# Patient Record
Sex: Female | Born: 1985 | Race: Black or African American | Hispanic: No | Marital: Single | Smoking: Never smoker
Health system: Southern US, Community
[De-identification: ages and names within clinical notes are randomized; demographics above are authoritative.]

## PROBLEM LIST (undated history)

## (undated) DIAGNOSIS — B009 Herpesviral infection, unspecified: Secondary | ICD-10-CM

## (undated) HISTORY — PX: TUBAL LIGATION: SHX77

---
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
## 2006-10-05 ENCOUNTER — Inpatient Hospital Stay (HOSPITAL_COMMUNITY): Admission: AD | Admit: 2006-10-05 | Discharge: 2006-10-05 | Payer: Self-pay | Admitting: Obstetrics and Gynecology

## 2006-10-18 ENCOUNTER — Inpatient Hospital Stay (HOSPITAL_COMMUNITY): Admission: AD | Admit: 2006-10-18 | Discharge: 2006-10-21 | Payer: Self-pay | Admitting: Obstetrics and Gynecology

## 2007-02-22 ENCOUNTER — Emergency Department (HOSPITAL_COMMUNITY): Admission: EM | Admit: 2007-02-22 | Discharge: 2007-02-22 | Payer: Self-pay | Admitting: Emergency Medicine

## 2008-05-11 ENCOUNTER — Emergency Department (HOSPITAL_COMMUNITY): Admission: EM | Admit: 2008-05-11 | Discharge: 2008-05-11 | Payer: Self-pay | Admitting: Family Medicine

## 2008-08-24 ENCOUNTER — Emergency Department (HOSPITAL_COMMUNITY): Admission: EM | Admit: 2008-08-24 | Discharge: 2008-08-24 | Payer: Self-pay | Admitting: Emergency Medicine

## 2008-12-16 ENCOUNTER — Inpatient Hospital Stay (HOSPITAL_COMMUNITY): Admission: AD | Admit: 2008-12-16 | Discharge: 2008-12-16 | Payer: Self-pay | Admitting: Obstetrics and Gynecology

## 2008-12-26 ENCOUNTER — Inpatient Hospital Stay (HOSPITAL_COMMUNITY): Admission: RE | Admit: 2008-12-26 | Discharge: 2008-12-28 | Payer: Self-pay | Admitting: Obstetrics and Gynecology

## 2009-07-05 ENCOUNTER — Ambulatory Visit: Payer: Self-pay | Admitting: Obstetrics & Gynecology

## 2009-07-13 ENCOUNTER — Emergency Department (HOSPITAL_COMMUNITY): Admission: EM | Admit: 2009-07-13 | Discharge: 2009-07-13 | Payer: Self-pay | Admitting: Family Medicine

## 2009-08-06 ENCOUNTER — Ambulatory Visit (HOSPITAL_COMMUNITY): Admission: RE | Admit: 2009-08-06 | Discharge: 2009-08-06 | Payer: Self-pay | Admitting: Obstetrics & Gynecology

## 2009-08-06 ENCOUNTER — Ambulatory Visit: Payer: Self-pay | Admitting: Obstetrics & Gynecology

## 2009-08-14 ENCOUNTER — Ambulatory Visit: Payer: Self-pay | Admitting: Obstetrics and Gynecology

## 2009-08-14 LAB — CONVERTED CEMR LAB
Clue Cells Wet Prep HPF POC: NONE SEEN
Trich, Wet Prep: NONE SEEN

## 2010-04-13 LAB — CBC
Hemoglobin: 13 g/dL (ref 12.0–15.0)
MCH: 32.6 pg (ref 26.0–34.0)
MCHC: 34.8 g/dL (ref 30.0–36.0)
Platelets: 224 10*3/uL (ref 150–400)
RBC: 3.99 MIL/uL (ref 3.87–5.11)
RDW: 12.9 % (ref 11.5–15.5)

## 2010-04-29 LAB — CBC
HCT: 31.6 % — ABNORMAL LOW (ref 36.0–46.0)
HCT: 34.2 % — ABNORMAL LOW (ref 36.0–46.0)
MCHC: 33.8 g/dL (ref 30.0–36.0)
MCV: 91.6 fL (ref 78.0–100.0)
Platelets: 194 10*3/uL (ref 150–400)
RDW: 13.5 % (ref 11.5–15.5)
WBC: 12.6 10*3/uL — ABNORMAL HIGH (ref 4.0–10.5)

## 2010-04-29 LAB — RPR: RPR Ser Ql: NONREACTIVE

## 2010-04-30 LAB — WET PREP, GENITAL: Yeast Wet Prep HPF POC: NONE SEEN

## 2010-05-04 LAB — WET PREP, GENITAL: Trich, Wet Prep: NONE SEEN

## 2010-05-04 LAB — URINALYSIS, ROUTINE W REFLEX MICROSCOPIC
Bilirubin Urine: NEGATIVE
Ketones, ur: NEGATIVE mg/dL
Nitrite: NEGATIVE
Protein, ur: NEGATIVE mg/dL

## 2010-05-04 LAB — URINE MICROSCOPIC-ADD ON

## 2010-05-18 ENCOUNTER — Emergency Department (HOSPITAL_COMMUNITY)
Admission: EM | Admit: 2010-05-18 | Discharge: 2010-05-19 | Disposition: A | Discharge status: Home / Self Care | Payer: Self-pay | Attending: Emergency Medicine | Admitting: Emergency Medicine

## 2010-05-18 DIAGNOSIS — N949 Unspecified condition associated with female genital organs and menstrual cycle: Secondary | ICD-10-CM | POA: Insufficient documentation

## 2010-05-18 DIAGNOSIS — Z760 Encounter for issue of repeat prescription: Secondary | ICD-10-CM | POA: Insufficient documentation

## 2010-05-18 DIAGNOSIS — N938 Other specified abnormal uterine and vaginal bleeding: Secondary | ICD-10-CM | POA: Insufficient documentation

## 2010-05-18 LAB — URINALYSIS, ROUTINE W REFLEX MICROSCOPIC
Bilirubin Urine: NEGATIVE
Ketones, ur: 15 mg/dL — AB
Protein, ur: NEGATIVE mg/dL
Specific Gravity, Urine: 1.03 (ref 1.005–1.030)
Urobilinogen, UA: 0.2 mg/dL (ref 0.0–1.0)
pH: 6 (ref 5.0–8.0)

## 2010-05-18 LAB — URINE MICROSCOPIC-ADD ON

## 2010-05-18 LAB — POCT PREGNANCY, URINE: Preg Test, Ur: NEGATIVE

## 2010-05-19 LAB — WET PREP, GENITAL
Clue Cells Wet Prep HPF POC: NONE SEEN
Trich, Wet Prep: NONE SEEN
Yeast Wet Prep HPF POC: NONE SEEN

## 2010-05-20 LAB — GC/CHLAMYDIA PROBE AMP, GENITAL: GC Probe Amp, Genital: NEGATIVE

## 2010-06-10 NOTE — H&P (Signed)
[name], Beck           ACCOUNT NO.:  [id number]   MEDICAL RECORD NO.:  [id number]          PATIENT TYPE:  INP   LOCATION:  9144                          FACILITY:  [facility]   PHYSICIAN:  Janine Limbo, M.D.DATE OF BIRTH:  11-Dec-1985   DATE OF ADMISSION:  10/18/2006  DATE OF DISCHARGE:                              HISTORY & PHYSICAL   HISTORY OF PRESENT ILLNESS:  The patient is a 25 year old, single, black  female, prima gravida at 39 1/7 weeks who presents with regular  contractions since 6:00 p.m. She denies leaking. She reports position  bright red bleeding. Reports positive fetal movement. Denies HSV  outbreak symptoms. Her pregnancy has been followed by the [facility] MD service and has been remarkable for  1. History of HSV2.  2. History of ASCUS Pap.  3. Group B Strep positive.   Her prenatal labs were collected on 03/31/06, hemoglobin 13.2, hematocrit  39.2, platelets 269,000, blood type 0 positive, antibody negative,  sickle cell trait negative, RPR nonreactive, Rubella immune, hepatitis B  surface antigen negative, HIV nonreactive, cystic fibrosis negative, Pap  with ASCUS at negative high risk HPV, first trimester screen from  04/14/06, within normal limits, 1-hour Glucola is unavailable. Group B  Strep from 10/01/06, was positive, gonorrhea and Chlamydia from that same  day were negative.   HISTORY OF PRESENT PREGNANCY:  The patient presented for care at [facility] on 04/10/06, at 10 3/7 weeks' gestation. The patient has normal  first-trimester screen. Her Pap smear showed ASCUS with negative high-  risk HPV. Plan was made for a repeat Pap in 12 months to include HPV  testing. Anatomy ultrasound at 18 3/7 weeks' gestation shows growth  consistent with previous dating, confirming EDC of 10/24/06. The patient  started Valtrex for HSV suppression at 34.5 weeks' gestation. She had  STD screening at 32 weeks' gestation that was all negative. The  rest of  her prenatal care has been unremarkable. She has remained size equal to  dates, normotensive with negative proteinuria throughout.   OB HISTORY:  She is a prima gravida.   PAST MEDICAL HISTORY:  She has no medication allergies. She experienced  menarche at the age of 52 with irregular cycles lasting 5 to 7 days. She  has used Yaz in the past for contraception. She reports having had the  usual childhood illnesses.   PAST SURGICAL HISTORY:  Surgical history is negative.   FAMILY MEDICAL HISTORY:  Remarkable for a paternal grandfather with  history of heart disease, maternal grandmother and mother with elevated  blood pressure, maternal grandmother with varicosities, maternal  grandmother with diabetes.   GENETIC HISTORY:  Negative.   SOCIAL HISTORY:  The patient is single. Father of the baby's name is  Donna Beck. The patient is a college. Father of the baby is high school  educated and employed as a Copy. They deny any alcohol, tobacco or  illicit drug use with the pregnancy.   OBJECTIVE DATA:  VITAL SIGNS: Stable. She is afebrile.  HEENT: Is grossly within normal limits.  CHEST: Clear to auscultation.  HEART: Regular rate  and rhythm.  ABDOMEN: The abdomen is gravid in contour with fundal height extending  approximately 39 cm above pubic symphysis. Fetal heart rate is reactive  and reassuring. Contractions are every 2 to 3 minutes.  PELVIC: Cervix is 4 cm, 100% effaced, vertex, -2 with intact membranes.  Speculum exam shows small to moderate bright red bleeding, no active  blood flow. No signs and symptoms of HSV outbreak.  EXTREMITIES: Normal.   ASSESSMENT:  1. Intrauterine pregnancy at term.  2. Early active labor.  3. Group B Strep positive.   PLAN:  1. Admit to birthing suites.  2. Routine MD orders.  3. Plan penicillin G for group B Strep prophylaxis.  4. Patient plans epidural for comfort.      Cam Hai, C.N.M.      Janine Limbo,  M.D.  Electronically Signed    KS/MEDQ  D:  10/18/2006  T:  10/19/2006  Job:  [id number]

## 2010-07-10 ENCOUNTER — Encounter: Payer: Self-pay | Admitting: Obstetrics & Gynecology

## 2010-11-06 LAB — CBC
HCT: 36.4
HCT: 38
Hemoglobin: 12.5
Hemoglobin: 13.2
MCHC: 34.3
Platelets: 175
Platelets: 206
RBC: 3.83 — ABNORMAL LOW
RDW: 13.3
WBC: 13.3 — ABNORMAL HIGH
WBC: 9.8

## 2010-11-07 LAB — WET PREP, GENITAL
Clue Cells Wet Prep HPF POC: NONE SEEN
Trich, Wet Prep: NONE SEEN

## 2011-11-28 IMAGING — CR DG CHEST 2V
2 series · 2 of 2 positions shown · non-contrast
Comparison: None.

CLINICAL DATA: Chest pain.

CHEST - 2 VIEW

[view not recorded (1 of 2)]
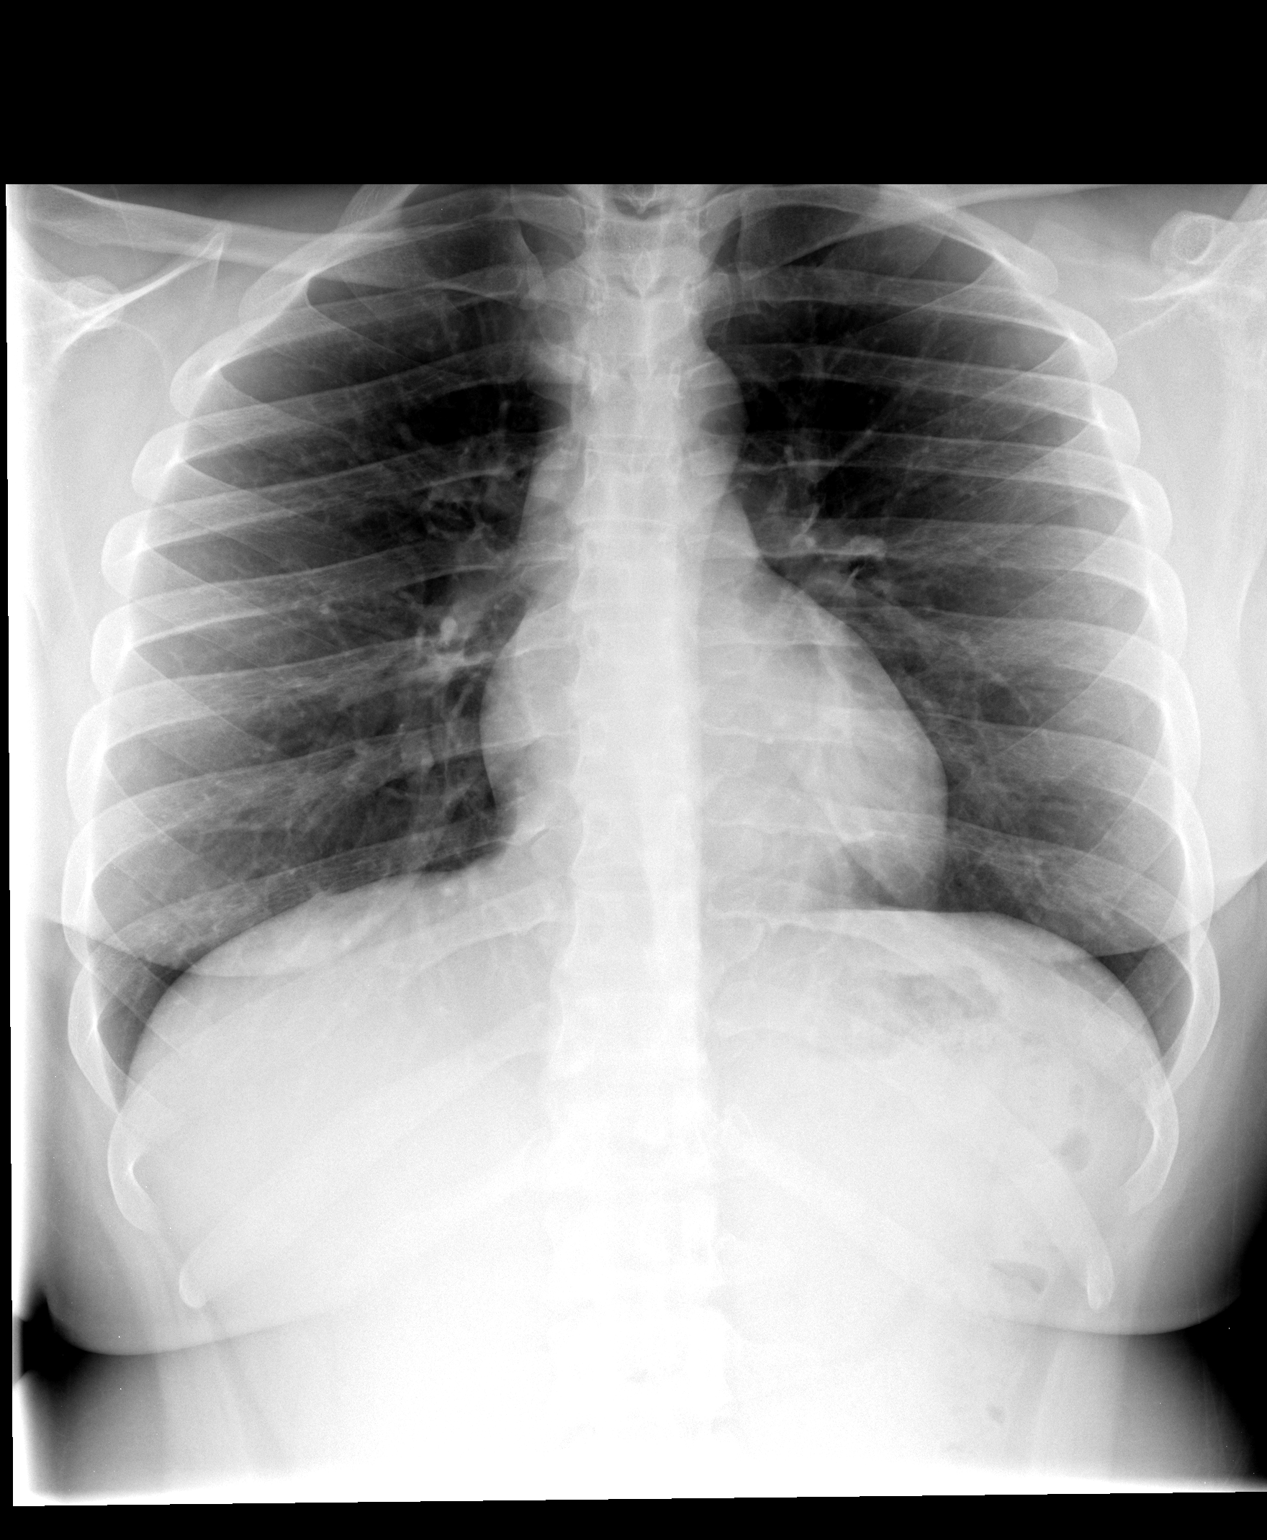

[view not recorded (2 of 2)]
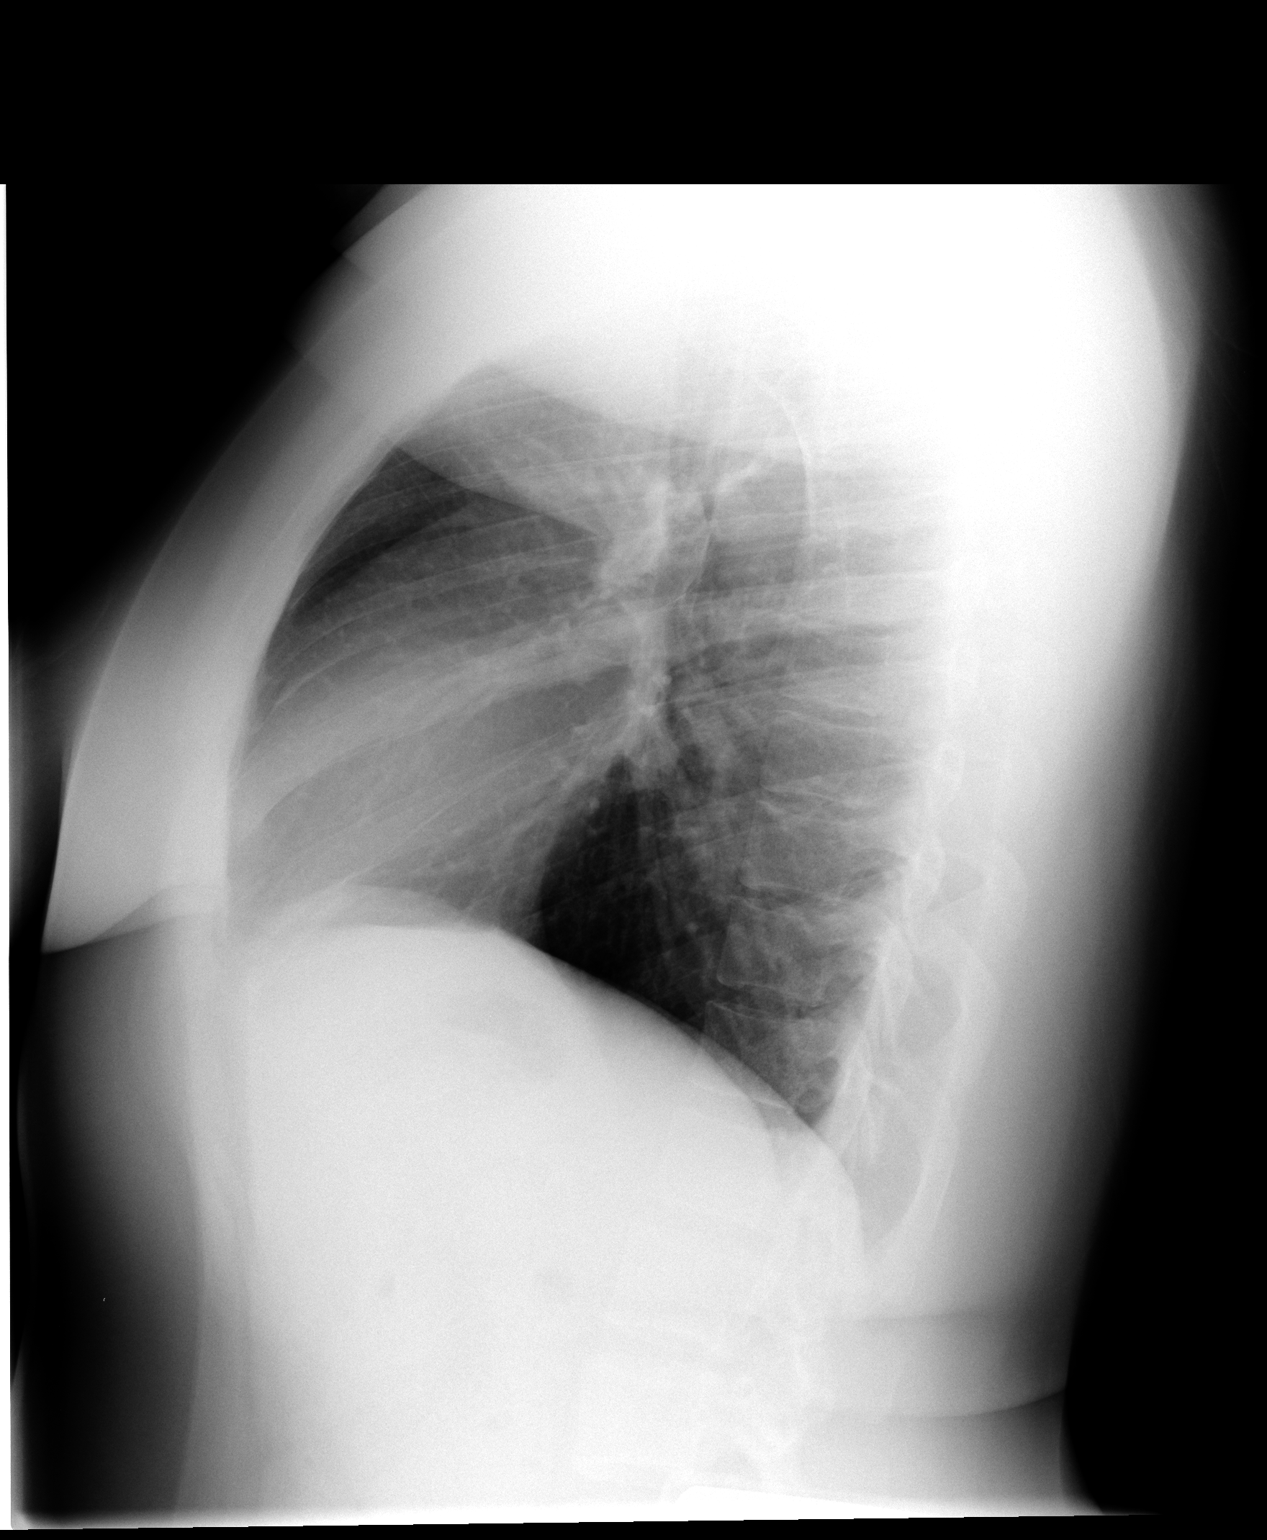

[2 of 2 positions shown; findings below may reference images not displayed]

FINDINGS: The lungs are clear.  Heart size is normal.  No pleural
effusion or focal bony abnormality.
IMPRESSION: No acute disease.

## 2014-03-28 ENCOUNTER — Encounter (HOSPITAL_COMMUNITY): Payer: Self-pay

## 2014-03-28 ENCOUNTER — Inpatient Hospital Stay (HOSPITAL_COMMUNITY)
Admission: AD | Admit: 2014-03-28 | Discharge: 2014-03-28 | Disposition: A | Discharge status: Home / Self Care | Payer: Medicaid Other | Source: Ambulatory Visit | Attending: Obstetrics & Gynecology | Admitting: Obstetrics & Gynecology

## 2014-03-28 DIAGNOSIS — A6000 Herpesviral infection of urogenital system, unspecified: Secondary | ICD-10-CM | POA: Insufficient documentation

## 2014-03-28 DIAGNOSIS — A6009 Herpesviral infection of other urogenital tract: Secondary | ICD-10-CM

## 2014-03-28 DIAGNOSIS — N898 Other specified noninflammatory disorders of vagina: Secondary | ICD-10-CM | POA: Diagnosis not present

## 2014-03-28 DIAGNOSIS — N9089 Other specified noninflammatory disorders of vulva and perineum: Secondary | ICD-10-CM | POA: Diagnosis present

## 2014-03-28 DIAGNOSIS — A609 Anogenital herpesviral infection, unspecified: Secondary | ICD-10-CM

## 2014-03-28 HISTORY — DX: Herpesviral infection, unspecified: B00.9

## 2014-03-28 LAB — WET PREP, GENITAL
CLUE CELLS WET PREP: NONE SEEN
Trich, Wet Prep: NONE SEEN
YEAST WET PREP: NONE SEEN

## 2014-03-28 LAB — URINALYSIS, ROUTINE W REFLEX MICROSCOPIC
BILIRUBIN URINE: NEGATIVE
GLUCOSE, UA: NEGATIVE mg/dL
Hgb urine dipstick: NEGATIVE
KETONES UR: NEGATIVE mg/dL
Leukocytes, UA: NEGATIVE
NITRITE: NEGATIVE
PH: 6 (ref 5.0–8.0)
Protein, ur: NEGATIVE mg/dL
SPECIFIC GRAVITY, URINE: 1.02 (ref 1.005–1.030)
Urobilinogen, UA: 0.2 mg/dL (ref 0.0–1.0)

## 2014-03-28 LAB — POCT PREGNANCY, URINE: Preg Test, Ur: NEGATIVE

## 2014-03-28 MED ORDER — VALACYCLOVIR HCL 1 G PO TABS: 1000.0000 mg | ORAL_TABLET | Freq: Three times a day (TID) | ORAL | Status: AC

## 2014-03-28 NOTE — Discharge Instructions (Signed)
Genital Herpes °Genital herpes is a sexually transmitted disease. This means that it is a disease passed by having sex with an infected person. There is no cure for genital herpes. The time between attacks can be months to years. The virus may live in a person but produce no problems (symptoms). This infection can be passed to a baby as it travels down the birth canal (vagina). In a newborn, this can cause central nervous system damage, eye damage, or even death. The virus that causes genital herpes is usually HSV-2 virus. The virus that causes oral herpes is usually HSV-1. The diagnosis (learning what is wrong) is made through culture results. °SYMPTOMS  °Usually symptoms of pain and itching begin a few days to a week after contact. It first appears as small blisters that progress to small painful ulcers which then scab over and heal after several days. It affects the outer genitalia, birth canal, cervix, penis, anal area, buttocks, and thighs. °HOME CARE INSTRUCTIONS  °· Keep ulcerated areas dry and clean. °· Take medications as directed. Antiviral medications can speed up healing. They will not prevent recurrences or cure this infection. These medications can also be taken for suppression if there are frequent recurrences. °· While the infection is active, it is contagious. Avoid all sexual contact during active infections. °· Condoms may help prevent spread of the herpes virus. °· Practice safe sex. °· Wash your hands thoroughly after touching the genital area. °· Avoid touching your eyes after touching your genital area. °· Inform your caregiver if you have had genital herpes and become pregnant. It is your responsibility to insure a safe outcome for your baby in this pregnancy. °· Only take over-the-counter or prescription medicines for pain, discomfort, or fever as directed by your caregiver. °SEEK MEDICAL CARE IF:  °· You have a recurrence of this infection. °· You do not respond to medications and are not  improving. °· You have new sources of pain or discharge which have changed from the original infection. °· You have an oral temperature above 102° F (38.9° C). °· You develop abdominal pain. °· You develop eye pain or signs of eye infection. °Document Released: 01/10/2000 Document Revised: 04/06/2011 Document Reviewed: 01/30/2009 °ExitCare® Patient Information ©2015 [employer]. This information is not intended to replace advice given to you by your health care provider. Make sure you discuss any questions you have with your health care provider. ° °

## 2014-03-28 NOTE — MAU Provider Note (Signed)
History     CSN: [id number]  Arrival date and time: 03/28/14 0944   First Provider Initiated Contact with Patient 03/28/14 1113      Chief Complaint  Patient presents with  . Shortness of Breath   HPI    Donna Beck is a 29 y.o. female G2P2002 who presents for STD testing. She has a history of genital herpes and needs a refill on her meds. She is also experiencing vaginal irritation that she feels is due to changing soaps.   OB History    Gravida Para Term Preterm AB TAB SAB Ectopic Multiple Living   2 2 2       2       Past Medical History  Diagnosis Date  . Herpes     Past Surgical History  Procedure Laterality Date  . Tubal ligation      History reviewed. No pertinent family history.  History  Substance Use Topics  . Smoking status: Never Smoker   . Smokeless tobacco: Not on file  . Alcohol Use: No    Allergies: No Known Allergies  Prescriptions prior to admission  Medication Sig Dispense Refill Last Dose  . Multiple Vitamin (MULTIVITAMIN WITH MINERALS) TABS tablet Take 1 tablet by mouth daily.   03/27/2014 at Unknown time   Results for orders placed or performed during the hospital encounter of 03/28/14 (from the past 48 hour(s))  Urinalysis, Routine w reflex microscopic     Status: None   Collection Time: 03/28/14 10:03 AM  Result Value Ref Range   Color, Urine YELLOW YELLOW   APPearance CLEAR CLEAR   Specific Gravity, Urine 1.020 1.005 - 1.030   pH 6.0 5.0 - 8.0   Glucose, UA NEGATIVE NEGATIVE mg/dL   Hgb urine dipstick NEGATIVE NEGATIVE   Bilirubin Urine NEGATIVE NEGATIVE   Ketones, ur NEGATIVE NEGATIVE mg/dL   Protein, ur NEGATIVE NEGATIVE mg/dL   Urobilinogen, UA 0.2 0.0 - 1.0 mg/dL   Nitrite NEGATIVE NEGATIVE   Leukocytes, UA NEGATIVE NEGATIVE    Comment: MICROSCOPIC NOT DONE ON URINES WITH NEGATIVE PROTEIN, BLOOD, LEUKOCYTES, NITRITE, OR GLUCOSE <1000 mg/dL.  Pregnancy, urine POC     Status: None   Collection Time: 03/28/14 10:20  AM  Result Value Ref Range   Preg Test, Ur NEGATIVE NEGATIVE    Comment:        THE SENSITIVITY OF THIS METHODOLOGY IS >24 mIU/mL    Results for orders placed or performed during the hospital encounter of 03/28/14 (from the past 48 hour(s))  Urinalysis, Routine w reflex microscopic     Status: None   Collection Time: 03/28/14 10:03 AM  Result Value Ref Range   Color, Urine YELLOW YELLOW   APPearance CLEAR CLEAR   Specific Gravity, Urine 1.020 1.005 - 1.030   pH 6.0 5.0 - 8.0   Glucose, UA NEGATIVE NEGATIVE mg/dL   Hgb urine dipstick NEGATIVE NEGATIVE   Bilirubin Urine NEGATIVE NEGATIVE   Ketones, ur NEGATIVE NEGATIVE mg/dL   Protein, ur NEGATIVE NEGATIVE mg/dL   Urobilinogen, UA 0.2 0.0 - 1.0 mg/dL   Nitrite NEGATIVE NEGATIVE   Leukocytes, UA NEGATIVE NEGATIVE    Comment: MICROSCOPIC NOT DONE ON URINES WITH NEGATIVE PROTEIN, BLOOD, LEUKOCYTES, NITRITE, OR GLUCOSE <1000 mg/dL.  Pregnancy, urine POC     Status: None   Collection Time: 03/28/14 10:20 AM  Result Value Ref Range   Preg Test, Ur NEGATIVE NEGATIVE    Comment:        THE SENSITIVITY  OF THIS METHODOLOGY IS >24 mIU/mL   Wet prep, genital     Status: Abnormal   Collection Time: 03/28/14 11:20 AM  Result Value Ref Range   Yeast Wet Prep HPF POC NONE SEEN NONE SEEN   Trich, Wet Prep NONE SEEN NONE SEEN   Clue Cells Wet Prep HPF POC NONE SEEN NONE SEEN   WBC, Wet Prep HPF POC FEW (A) NONE SEEN    Comment: FEW BACTERIA SEEN     Review of Systems  Constitutional: Negative for fever and chills.  Respiratory: Negative for shortness of breath (none currently ).   Gastrointestinal: Negative for nausea, vomiting and abdominal pain.  Genitourinary: Negative for dysuria, urgency, frequency and hematuria.   Physical Exam   Blood pressure 112/75, pulse 66, temperature 98.1 F (36.7 C), resp. rate 18, height  (1.6 m), weight 96.616 kg (213 lb), last menstrual period 03/11/2014, SpO2 100 %.  Physical Exam   Constitutional: She is oriented to person, place, and time. She appears well-developed and well-nourished. No distress.  HENT:  Head: Normocephalic.  Eyes: Pupils are equal, round, and reactive to light.  Neck: Neck supple.  Respiratory: Effort normal.  GI: Soft. She exhibits no distension. There is no tenderness. There is no rebound and no guarding.  Genitourinary:  Speculum exam: Vagina - Small amount of creamy discharge, no odor Cervix - No contact bleeding Bimanual exam: Cervix closed, no CMT  Uterus non tender, normal size Adnexa non tender, no masses bilaterally GC/Chlam, wet prep done Chaperone present for exam.   Musculoskeletal: Normal range of motion.  Neurological: She is alert and oriented to person, place, and time.  Skin: Skin is warm. She is not diaphoretic.  Psychiatric: Her behavior is normal.    MAU Course  Procedures  None  MDM Wet prep GC  Assessment and Plan   A:  1. Vaginal discharge   2. Genital herpes in women     P:  Discharge home in stable condition  Normal exam RX: Valtrex Follow up with the HD as needed   Debbrah Alar, NP 03/28/2014 11:19 AM

## 2014-03-28 NOTE — MAU Note (Signed)
Pt requesting STD testing and medication for HSV outbreak

## 2014-03-28 NOTE — MAU Note (Signed)
Pt presents to MAU with complaints of SOB for an hour and states vaginal irritation for a couple of months. Denies any vaginal bleeding

## 2014-03-29 LAB — GC/CHLAMYDIA PROBE AMP (~~LOC~~) NOT AT ARMC
Chlamydia: NEGATIVE
Neisseria Gonorrhea: NEGATIVE

## 2014-03-29 LAB — HIV ANTIBODY (ROUTINE TESTING W REFLEX): HIV SCREEN 4TH GENERATION: NONREACTIVE

## 2019-02-05 ENCOUNTER — Encounter (HOSPITAL_COMMUNITY): Payer: Self-pay | Admitting: *Deleted

## 2019-02-05 ENCOUNTER — Ambulatory Visit (HOSPITAL_COMMUNITY)
Admission: EM | Admit: 2019-02-05 | Discharge: 2019-02-05 | Disposition: A | Discharge status: Home / Self Care | Payer: Self-pay | Attending: Family Medicine | Admitting: Family Medicine

## 2019-02-05 ENCOUNTER — Other Ambulatory Visit: Payer: Self-pay

## 2019-02-05 DIAGNOSIS — Z202 Contact with and (suspected) exposure to infections with a predominantly sexual mode of transmission: Secondary | ICD-10-CM | POA: Insufficient documentation

## 2019-02-05 MED ORDER — VALACYCLOVIR HCL 500 MG PO TABS: 500.0000 mg | ORAL_TABLET | Freq: Two times a day (BID) | ORAL | 1 refills | Status: DC

## 2019-02-05 NOTE — Discharge Instructions (Addendum)
Will call with results in 1 or 2 days Call here if you have not been contacted

## 2019-02-05 NOTE — ED Triage Notes (Signed)
Pt denies any sxs or known exposures, states wishes to be tested for STDs due to new sexual partner.

## 2019-02-05 NOTE — ED Provider Notes (Signed)
[place]    CSN: [id number] Arrival date & time: 02/05/19  1014      History   Chief Complaint Chief Complaint  Patient presents with  . Exposure to STD    HPI Donna Beck is a 34 y.o. female.   Patient is asymptomatic except for intermittent herpes that she has.  There was a sexual exposure in August and that is causing her concern now.  Sexual partner was not having any symptoms per history.  HPI  Past Medical History:  Diagnosis Date  . Herpes     Patient Active Problem List   Diagnosis Date Noted  . Possible exposure to STD 02/05/2019    Past Surgical History:  Procedure Laterality Date  . TUBAL LIGATION      OB History    Gravida  2   Para  2   Term  2   Preterm      AB      Living  2     SAB      TAB      Ectopic      Multiple      Live Births  2            Home Medications    Prior to Admission medications   Medication Sig Start Date End Date Taking? Authorizing Provider  Multiple Vitamin (MULTIVITAMIN WITH MINERALS) TABS tablet Take 1 tablet by mouth daily.    [provider]  valACYclovir (VALTREX) 500 MG tablet Take 1 tablet (500 mg total) by mouth 2 (two) times daily. 02/05/19   Wardell Honour, MD    Family History Family History  Problem Relation Age of Onset  . Hypertension Mother   . Healthy Father     Social History Social History   Tobacco Use  . Smoking status: Never Smoker  . Smokeless tobacco: Never Used  Substance Use Topics  . Alcohol use: Yes    Comment: occasional  . Drug use: Yes    Types: Marijuana     Allergies   Patient has no known allergies.   Review of Systems Review of Systems  All other systems reviewed and are negative.    Physical Exam Triage Vital Signs ED Triage Vitals  Enc Vitals Group     BP 02/05/19 1056 133/84     Pulse Rate 02/05/19 1053 74     Resp 02/05/19 1053 16     Temp 02/05/19 1056 98.6 F (37 C)     Temp Source 02/05/19  1056 Oral     SpO2 02/05/19 1053 100 %     Weight --      Height --      Head Circumference --      Peak Flow --      Pain Score 02/05/19 1055 0     Pain Loc --      Pain Edu? --      Excl. in [place]? --    No data found.  Updated Vital Signs BP 133/84   Pulse 74   Temp 98.6 F (37 C) (Oral)   Resp 16   LMP 12/20/2018 (Exact Date) Comment: States normal for her to have irregular periods  SpO2 100%   Visual Acuity Right Eye Distance:   Left Eye Distance:   Bilateral Distance:    Right Eye Near:   Left Eye Near:    Bilateral Near:     Physical Exam Constitutional:  Appearance: Normal appearance. She is obese.  Genitourinary:    Comments: Pelvic exam: Scant amount of white discharge in vaginal vault specimen taken from endocervix to screen for HPV GC chlamydia trichomonas Neurological:     Mental Status: She is alert.      UC Treatments / Results  Labs (all labs ordered are listed, but only abnormal results are displayed) Labs Reviewed  CERVICOVAGINAL ANCILLARY ONLY  CERVICOVAGINAL ANCILLARY ONLY    EKG   Radiology No results found.  Procedures Procedures (including critical care time)  Medications Ordered in UC Medications - No data to display  Initial Impression / Assessment and Plan / UC Course  I have reviewed the triage vital signs and the nursing notes.  Pertinent labs & imaging results that were available during my care of the patient were reviewed by me and considered in my medical decision making (see chart for details).     STD exposure.  Will withhold treatment since patient is asymptomatic.  Depending on results we will call in medicine or have her come back. Final Clinical Impressions(s) / UC Diagnoses   Final diagnoses:  Possible exposure to STD     Discharge Instructions     Will call with results in 1 or 2 days Call here if you have not been contacted   ED Prescriptions    Medication Sig Dispense Auth. Provider    valACYclovir (VALTREX) 500 MG tablet Take 1 tablet (500 mg total) by mouth 2 (two) times daily. 30 tablet Frederica Kuster, MD     PDMP not reviewed this encounter.   Frederica Kuster, MD 02/05/19 1143

## 2019-02-07 LAB — CERVICOVAGINAL ANCILLARY ONLY
Bacterial vaginitis: NEGATIVE
Candida vaginitis: NEGATIVE
Chlamydia: NEGATIVE
Neisseria Gonorrhea: NEGATIVE
Trichomonas: NEGATIVE

## 2020-05-03 ENCOUNTER — Other Ambulatory Visit: Payer: Self-pay | Admitting: Family Medicine

## 2021-04-17 ENCOUNTER — Encounter (HOSPITAL_COMMUNITY): Payer: Self-pay | Admitting: Emergency Medicine

## 2021-04-17 ENCOUNTER — Ambulatory Visit (HOSPITAL_COMMUNITY)
Admission: EM | Admit: 2021-04-17 | Discharge: 2021-04-17 | Disposition: A | Discharge status: Home / Self Care | Payer: Self-pay | Attending: Family Medicine | Admitting: Family Medicine

## 2021-04-17 DIAGNOSIS — R21 Rash and other nonspecific skin eruption: Secondary | ICD-10-CM

## 2021-04-17 MED ORDER — KETOCONAZOLE 2 % EX CREA: 1.0000 "application " | TOPICAL_CREAM | Freq: Two times a day (BID) | CUTANEOUS | 0 refills | Status: DC

## 2021-04-17 MED ORDER — TRIAMCINOLONE ACETONIDE 0.1 % EX CREA: 1.0000 "application " | TOPICAL_CREAM | Freq: Two times a day (BID) | CUTANEOUS | 0 refills | Status: DC

## 2021-04-17 NOTE — ED Triage Notes (Signed)
Pt is present today with concerns for a rash on her right hand and left shoulder. Pt states that she noticed the rash a couple weeks ago. Pt denies any pain or discomfort.  ? ? Pt also states that she noticed a strain near her pelvic region when she works out  ?

## 2021-04-17 NOTE — ED Provider Notes (Signed)
?[facility] ? ? ? ?CSN: [id number] ?Arrival date & time: 04/17/21  1721 ? ? ?  ? ?History   ?Chief Complaint ?Chief Complaint  ?Patient presents with  ? Rash  ? ? ?HPI ?Donna Beck is a 36 y.o. female.  ? ? ?Rash ?Here for a little bit of rash on her right hand on the dorsum.  Donna Beck noticed it previously, and it improved with ginger and honey but now it is returned ? ?.  Donna Beck also notes some tiny punctate areas of hypopigmentation--1 is recently appeared on her dorsum of her right hand separate from the rash mentioned above.  Donna Beck also has 1 on her left upper arm and on her right shoulder and on her left breast.  All of them are 2 mm or less.  There is no erythema or rolled edges or induration with these places. ? ?Donna Beck also notes some soreness over her symphysis pubis.  No abdominal pain and no dysuria and no vaginal discharge.  Donna Beck states Donna Beck has had STD testing that was negative.  Donna Beck has not had a Pap in a few years.  Donna Beck states Donna Beck is not sexually active in the last couple of years. ? ?Past Medical History:  ?Diagnosis Date  ? Herpes   ? ? ?Patient Active Problem List  ? Diagnosis Date Noted  ? Possible exposure to STD 02/05/2019  ? ? ?Past Surgical History:  ?Procedure Laterality Date  ? TUBAL LIGATION    ? ? ?OB History   ? ? Gravida  ?2  ? Para  ?2  ? Term  ?2  ? Preterm  ?   ? AB  ?   ? Living  ?2  ?  ? ? SAB  ?   ? IAB  ?   ? Ectopic  ?   ? Multiple  ?   ? Live Births  ?2  ?   ?  ?  ? ? ? ?Home Medications   ? ?Prior to Admission medications   ?Medication Sig Start Date End Date Taking? Authorizing Provider  ?ketoconazole (NIZORAL) 2 % cream Apply 1 application. topically 2 (two) times daily. 04/17/21  Yes Zenia ResidesBanister, Knut Rondinelli K, MD  ?triamcinolone cream (KENALOG) 0.1 % Apply 1 application. topically 2 (two) times daily. 04/17/21  Yes Zenia ResidesBanister, Gilmar Bua K, MD  ?Multiple Vitamin (MULTIVITAMIN WITH MINERALS) TABS tablet Take 1 tablet by mouth daily.    [provider]  ? ? ?Family  History ?Family History  ?Problem Relation Age of Onset  ? Hypertension Mother   ? Healthy Father   ? ? ?Social History ?Social History  ? ?Tobacco Use  ? Smoking status: Never  ? Smokeless tobacco: Never  ?Vaping Use  ? Vaping Use: Never used  ?Substance Use Topics  ? Alcohol use: Yes  ?  Comment: occasional  ? Drug use: Yes  ?  Types: Marijuana  ? ? ? ?Allergies   ?Patient has no known allergies. ? ? ?Review of Systems ?Review of Systems  ?Skin:  Positive for rash.  ? ? ?Physical Exam ?Triage Vital Signs ?ED Triage Vitals  ?Enc Vitals Group  ?   BP 04/17/21 1748 126/84  ?   Pulse Rate 04/17/21 1748 75  ?   Resp 04/17/21 1748 18  ?   Temp 04/17/21 1748 98.4 ?F (36.9 ?C)  ?   Temp Source 04/17/21 1748 Oral  ?   SpO2 04/17/21 1748 98 %  ?   Weight --   ?  Height --   ?   Head Circumference --   ?   Peak Flow --   ?   Pain Score 04/17/21 1747 0  ?   Pain Loc --   ?   Pain Edu? --   ?   Excl. in [place]? --   ? ?No data found. ? ?Updated Vital Signs ?BP 126/84   Pulse 75   Temp 98.4 ?F (36.9 ?C) (Oral)   Resp 18   SpO2 98%  ? ?Visual Acuity ?Right Eye Distance:   ?Left Eye Distance:   ?Bilateral Distance:   ? ?Right Eye Near:   ?Left Eye Near:    ?Bilateral Near:    ? ?Physical Exam ?Vitals reviewed.  ?Constitutional:   ?   General: Donna Beck is not in acute distress. ?   Appearance: Donna Beck is not toxic-appearing.  ?HENT:  ?   Mouth/Throat:  ?   Mouth: Mucous membranes are moist.  ?   Pharynx: No oropharyngeal exudate or posterior oropharyngeal erythema.  ?Eyes:  ?   Extraocular Movements: Extraocular movements intact.  ?   Pupils: Pupils are equal, round, and reactive to light.  ?Cardiovascular:  ?   Rate and Rhythm: Normal rate and regular rhythm.  ?Pulmonary:  ?   Effort: Pulmonary effort is normal.  ?   Breath sounds: Normal breath sounds.  ?Musculoskeletal:  ?   Cervical back: Neck supple.  ?Lymphadenopathy:  ?   Cervical: No cervical adenopathy.  ?Skin: ?   Coloration: Skin is not jaundiced or pale.  ?   Comments: There  are 1 to 2 mm flat hypopigmented areas on her right hand left upper arm left breast and right shoulder.  In total the number about 7-8.  No rolled edges and no erythema and no induration ? ?Donna Beck also has an area that is circular with some central clearing on her right hand with a diameter of about 2 cm.  No induration there either  ?Neurological:  ?   Mental Status: Donna Beck is alert and oriented to person, place, and time.  ?Psychiatric:     ?   Behavior: Behavior normal.  ? ? ? ?UC Treatments / Results  ?Labs ?(all labs ordered are listed, but only abnormal results are displayed) ?Labs Reviewed - No data to display ? ?EKG ? ? ?Radiology ?No results found. ? ?Procedures ?Procedures (including critical care time) ? ?Medications Ordered in UC ?Medications - No data to display ? ?Initial Impression / Assessment and Plan / UC Course  ?I have reviewed the triage vital signs and the nursing notes. ? ?Pertinent labs & imaging results that were available during my care of the patient were reviewed by me and considered in my medical decision making (see chart for details). ? ?  ? ?We will treat the circular rash with an antifungal and a steroid cream.  Assistance requested to help her find a PCP.  Also Donna Beck will be given contact information for dermatology ?Final Clinical Impressions(s) / UC Diagnoses  ? ?Final diagnoses:  ?Rash  ? ? ? ?Discharge Instructions   ? ?  ?Use triamcinolone cream twice daily to the circular rash on the back of your right hand, morning and evening ? ?Also use ketoconazole twice daily to the same rash, alternating with the other cream, possibly using it at noon and at bedtime ? ? ? ? ?ED Prescriptions   ? ? Medication Sig Dispense Auth. Provider  ? triamcinolone cream (KENALOG) 0.1 % Apply 1 application. topically  2 (two) times daily. 30 g Zenia Resides, MD  ? ketoconazole (NIZORAL) 2 % cream Apply 1 application. topically 2 (two) times daily. 15 g Zenia Resides, MD  ? ?  ? ?PDMP not reviewed  this encounter. ?  ?Zenia Resides, MD ?04/17/21 1819 ? ?

## 2021-04-17 NOTE — Discharge Instructions (Addendum)
Use triamcinolone cream twice daily to the circular rash on the back of your right hand, morning and evening ? ?Also use ketoconazole twice daily to the same rash, alternating with the other cream, possibly using it at noon and at bedtime ?

## 2021-04-18 ENCOUNTER — Encounter (HOSPITAL_COMMUNITY): Payer: Self-pay

## 2021-05-25 ENCOUNTER — Encounter (HOSPITAL_COMMUNITY): Payer: Self-pay | Admitting: Oncology

## 2021-05-25 ENCOUNTER — Emergency Department (HOSPITAL_COMMUNITY)
Admission: EM | Admit: 2021-05-25 | Discharge: 2021-05-25 | Disposition: A | Discharge status: Home / Self Care | Payer: Medicaid Other | Attending: Emergency Medicine | Admitting: Emergency Medicine

## 2021-05-25 ENCOUNTER — Other Ambulatory Visit: Payer: Self-pay

## 2021-05-25 DIAGNOSIS — R202 Paresthesia of skin: Secondary | ICD-10-CM | POA: Insufficient documentation

## 2021-05-25 DIAGNOSIS — G459 Transient cerebral ischemic attack, unspecified: Secondary | ICD-10-CM | POA: Insufficient documentation

## 2021-05-25 DIAGNOSIS — I10 Essential (primary) hypertension: Secondary | ICD-10-CM | POA: Insufficient documentation

## 2021-05-25 LAB — CBC WITH DIFFERENTIAL/PLATELET
Abs Immature Granulocytes: 0.01 10*3/uL (ref 0.00–0.07)
Basophils Absolute: 0 10*3/uL (ref 0.0–0.1)
Basophils Relative: 1 %
Eosinophils Absolute: 0.1 10*3/uL (ref 0.0–0.5)
Eosinophils Relative: 2 %
HCT: 39.6 % (ref 36.0–46.0)
Hemoglobin: 13.6 g/dL (ref 12.0–15.0)
Immature Granulocytes: 0 %
Lymphocytes Relative: 29 %
Lymphs Abs: 2.1 10*3/uL (ref 0.7–4.0)
MCH: 31.6 pg (ref 26.0–34.0)
MCHC: 34.3 g/dL (ref 30.0–36.0)
MCV: 91.9 fL (ref 80.0–100.0)
Monocytes Absolute: 0.4 10*3/uL (ref 0.1–1.0)
Monocytes Relative: 6 %
Neutro Abs: 4.7 10*3/uL (ref 1.7–7.7)
Neutrophils Relative %: 62 %
Platelets: 245 10*3/uL (ref 150–400)
RBC: 4.31 MIL/uL (ref 3.87–5.11)
RDW: 13.2 % (ref 11.5–15.5)
WBC: 7.4 10*3/uL (ref 4.0–10.5)
nRBC: 0 % (ref 0.0–0.2)

## 2021-05-25 LAB — BASIC METABOLIC PANEL
Anion gap: 3 — ABNORMAL LOW (ref 5–15)
BUN: 9 mg/dL (ref 6–20)
CO2: 24 mmol/L (ref 22–32)
Calcium: 8.9 mg/dL (ref 8.9–10.3)
Chloride: 109 mmol/L (ref 98–111)
Creatinine, Ser: 0.78 mg/dL (ref 0.44–1.00)
GFR, Estimated: >60 mL/min (ref >60)
Glucose, Bld: 99 mg/dL (ref 70–99)
Potassium: 3.8 mmol/L (ref 3.5–5.1)
Sodium: 136 mmol/L (ref 135–145)

## 2021-05-25 NOTE — ED Provider Notes (Signed)
?[facility] DEPT ?Provider Note ? ? ?CSN: [id number] ?Arrival date & time: 05/25/21  1701 ? ?  ? ?History ? ?Chief Complaint  ?Patient presents with  ? Hypertension  ? ? ?Donna Beck is a 36 y.o. female. ? ?HPI ? ?  ?36 year old female comes in with chief complaint of elevated blood pressure. ? ?Patient indicates that about 2 or 3 hours ago, she was watching TV and started having right-sided numbness/tingling.  She thereafter went to [facility], had her blood pressure checked and was in the 140s, getting her concerned. ? ?She describes her symptoms as " right arm falling asleep".  She has no history of similar symptoms.  Tingling sensation was worse over her fingers.  During my assessment, patient initially had tingling sensation present in her fingers, but by the time we were examining her, the tingling had resolved completely. ? ?She denies any associated facial droop, dizziness, vision change, vision loss, slurred speech, focal weakness. ? ?Patient has no significant medical history.  She denies any heavy smoking or drinking.  There is no family history of MS or premature strokes or CAD. ? ?Home Medications ?Prior to Admission medications   ?Medication Sig Start Date End Date Taking? Authorizing Provider  ?ketoconazole (NIZORAL) 2 % cream Apply 1 application. topically 2 (two) times daily. 04/17/21   Barrett Henle, MD  ?Multiple Vitamin (MULTIVITAMIN WITH MINERALS) TABS tablet Take 1 tablet by mouth daily.    [provider]  ?triamcinolone cream (KENALOG) 0.1 % Apply 1 application. topically 2 (two) times daily. 04/17/21   Barrett Henle, MD  ?   ? ?Allergies    ?Patient has no known allergies.   ? ?Review of Systems   ?Review of Systems  ?All other systems reviewed and are negative. ? ?Physical Exam ?Updated Vital Signs ?BP (!) 170/102   Pulse 73   Temp 98.8 ?F (37.1 ?C) (Oral)   Resp 18   LMP 05/04/2021 (Approximate)   SpO2 100%  ?Physical Exam ?Vitals and  nursing note reviewed.  ?Constitutional:   ?   Appearance: She is well-developed.  ?HENT:  ?   Head: Atraumatic.  ?Eyes:  ?   Extraocular Movements: Extraocular movements intact.  ?   Pupils: Pupils are equal, round, and reactive to light.  ?Cardiovascular:  ?   Rate and Rhythm: Normal rate.  ?Pulmonary:  ?   Effort: Pulmonary effort is normal.  ?Abdominal:  ?   Tenderness: There is no abdominal tenderness.  ?   Hernia: No hernia is present.  ?Musculoskeletal:  ?   Cervical back: Normal range of motion and neck supple.  ?Skin: ?   General: Skin is warm and dry.  ?Neurological:  ?   Mental Status: She is alert and oriented to person, place, and time.  ?   Cranial Nerves: No cranial nerve deficit.  ?   Sensory: No sensory deficit.  ?   Motor: No weakness.  ?   Coordination: Coordination normal.  ? ? ?ED Results / Procedures / Treatments   ?Labs ?(all labs ordered are listed, but only abnormal results are displayed) ?Labs Reviewed  ?BASIC METABOLIC PANEL - Abnormal; Notable for the following components:  ?    Result Value  ? Anion gap 3 (*)   ? All other components within normal limits  ?CBC WITH DIFFERENTIAL/PLATELET  ? ? ?EKG ?None ? ?Radiology ?No results found. ? ?Procedures ?Procedures  ? ? ?Medications Ordered in ED ?Medications - No data to  display ? ?ED Course/ Medical Decision Making/ A&P ?  ?                        ?Medical Decision Making ?Amount and/or Complexity of Data Reviewed ?Labs: ordered. ? ? ?This patient presents to the ED with chief complaint(s) of elevated blood pressure, right-sided numbness -symptoms are resolving with no history of any pertinent past medical history. ? ?Patient also mentions some right-sided lower quadrant abdominal pain.  She has no signs of hernia, the pain has been present now for several months, does not appear to be acute appendicitis or incarcerated hernia. ? ?The differential diagnosis for her intermittent right-sided upper extremity paresthesia includes TIA, myelitis,  radiculopathy, electrolyte abnormality. ? ?The initial plan is to order basic labs only.  On exam, patient has no midline C-spine tenderness.  Her pain is not worse with any neck movement.  Doubt that she has cervical radiculopathy. ? ?Patient also does not have any significant cardiovascular risk factor.  Her ABCD2 score is 0.  I am unsure if patient actually had a TIA.  Anxiety could also be a possibility, but it would be a diagnosis of exclusion. ? ?If the basic blood work-up is reassuring, will start patient on baby aspirin and have her follow-up with: Wellness. ? ?I have sent her information to our social work to ensure that she gets close follow-up.  In this patient's case, there is social determinant of health present which can impact her wellbeing, and it is lack of insurance. ? ?I considered CT scan of the brain, however we do not think she has brain bleed.  I do not think CT brain will add any significant value. ? ?Patient informed of our concerns.  Strict ER return precautions discussed.  Advised that she start taking baby aspirin every day. ? ? ? ?Final Clinical Impression(s) / ED Diagnoses ?Final diagnoses:  ?Paresthesia  ?TIA (transient ischemic attack)  ? ? ?Rx / DC Orders ?ED Discharge Orders   ? ? None  ? ?  ? ? ?  ?Varney Biles, MD ?05/25/21 2035 ? ?

## 2021-05-25 NOTE — ED Triage Notes (Signed)
Pt here d/t HTN. Pt took her BP while at [facility] noting it to be 138/80.  Pt has non specific c/o right arm falling asleep more often when lying on it. Pt endorsing pins and needles to right arm at this time. Pt also c/o intermittent RLQ sharp pains.  ?

## 2021-05-25 NOTE — Discharge Instructions (Addendum)
We saw you in the ER for NUMBNESS. ? ?Labs are normal. We are not sure what is causing your symptoms. One of the possibilities is TIA (read instructions below). ? ?The workup in the ER is not complete, and is limited to screening for life threatening and emergent conditions only, so please see a primary care doctor for further evaluation. ? ?Return to the ER if you start having one sided numbness, weakness, slurred speech. ? ?We have given your information to the Social Worker  -they will try to get you in to a primary care doctor for follow up. Start taking baby aspirin daily. ?

## 2021-05-27 ENCOUNTER — Telehealth: Payer: Self-pay | Admitting: Surgery

## 2021-05-27 NOTE — Telephone Encounter (Signed)
Patient's contact information sent to the [facility] to establish primary care,.  ?

## 2021-06-04 ENCOUNTER — Encounter: Payer: Self-pay | Admitting: Internal Medicine

## 2021-06-04 ENCOUNTER — Ambulatory Visit: Payer: Self-pay | Admitting: Internal Medicine

## 2021-06-04 DIAGNOSIS — Z09 Encounter for follow-up examination after completed treatment for conditions other than malignant neoplasm: Secondary | ICD-10-CM

## 2021-06-04 DIAGNOSIS — R030 Elevated blood-pressure reading, without diagnosis of hypertension: Secondary | ICD-10-CM

## 2021-06-04 NOTE — Assessment & Plan Note (Signed)
Diastolic blood pressure is just mildly above goal in the clinic today-- 137/93.  She does not routinely see medical providers however prior blood pressures at regular visits have been within normal limits.  She has not plan to establish with [place] in the clinic however I encouraged her to continue to monitor her blood pressures and return to the office if she starts to have consistently elevated blood pressures above 140/90. ?

## 2021-06-04 NOTE — Progress Notes (Addendum)
? ?Office Visit ? ? Patient ID: Donna Beck, female    DOB: October 22, 1985, 36 y.o.   MRN: [id number]   PCP: Patient, No Pcp Per (Inactive)  ? ?Subjective:  ? ?Donna Beck is a 36 y.o. year old female with no significant past medical history who presents to the clinic today for ER follow-up.  Please refer to problem based charting for details of assessment and plan. ? ?Past Medical History:  ?Diagnosis Date  ? Herpes   ? ?Family History  ?Problem Relation Age of Onset  ? Hypertension Mother   ? Healthy Father   ? ?Social History  ? ?Socioeconomic History  ? Marital status: Single  ?  Spouse name: Not on file  ? Number of children: Not on file  ? Years of education: Not on file  ? Highest education level: Not on file  ?Occupational History  ? Not on file  ?Tobacco Use  ? Smoking status: Never  ? Smokeless tobacco: Never  ?Vaping Use  ? Vaping Use: Never used  ?Substance and Sexual Activity  ? Alcohol use: Yes  ?  Comment: occasional  ? Drug use: Yes  ?  Types: Marijuana  ? Sexual activity: Yes  ?  Birth control/protection: Surgical, Condom  ?Other Topics Concern  ? Not on file  ?Social History Narrative  ? Not on file  ? ?Social Determinants of Health  ? ?Financial Resource Strain: Not on file  ?Food Insecurity: Not on file  ?Transportation Needs: Not on file  ?Physical Activity: Not on file  ?Stress: Not on file  ?Social Connections: Not on file  ?Intimate Partner Violence: Not on file  ? ? ? ?Objective:  ? ?BP (!) 137/93 (BP Location: Right Arm, Patient Position: Sitting, Cuff Size: Normal)   Pulse 78   Temp 98.9 ?F (37.2 ?C) (Oral)   Ht 5\' 3"  (1.6 m)   Wt 241 lb 12.8 oz (109.7 kg)   LMP 05/05/2021   SpO2 100%   BMI 42.83 kg/m?  ? ?General: Well-appearing obese female in no acute distress ?Cardiac: Heart regular rate and rhythm, no lower extremity edema ?Pulm: Breathing comfortably on room air, lung sounds are clear ?Neuro: Alert and oriented x4.  No dysarthria.  Cranial nerves III through XII  grossly intact.  5 out of 5 strength in the bilateral upper and lower extremities.  Sensation intact in the bilateral upper extremities.  Gait normal. ?Assessment & Plan:  ? ? Hospital discharge follow-up  ?  She presents today for ER follow-up.  She presented to [facility] long ED on 4/30 for 2-hour history of tingling in her right fingers that occurred while she was sitting on the couch.  The tingling involved all 5 digits.  No associated numbness or weakness in the hand or arm.  She did not exhibit any other neurologic symptoms including slurred speech, facial droop, or dysarthria during that time.  She was not experiencing a headache at that time.  Symptoms resolved while in the ED.  Initial labs including a CBC and BMP were unrevealing.  No intracranial imaging was pursued due to low suspicion for bleed/stroke.  She denies a personal history of stroke, hypertension, diabetes, hyperlipidemia.  She does not smoke. ?No recurrent symptoms since ER discharge. ? ?Assessment: Etiology of her symptoms remains unclear.  I considered a nerve or vascular compression due to the way she was sitting in the chair at the time that the symptoms began but I would not expect the paresthesias to  last 2 hours.  Low suspicion for TIA given her age and, aside from obesity, minimal risk factors. ? ?Plan:  ?I do not think further work-up for the paresthesia is warranted at this time. Encouraged her to seek reevaluation if her symptoms return. ?Could consider checking an A1c in the setting of obesity however she does not have insurance and does not wish to pursue lab testing at today's visit.  I welcomed her to establish with our office to service the role of PCP however she is not inclined to do so.  She was encouraged to reach out to our office should she change her mind. ? ?  ?  ? Elevated blood-pressure reading without diagnosis of hypertension  ?  Diastolic blood pressure is just mildly above goal in the clinic today-- 137/93.  She  does not routinely see medical providers however prior blood pressures at regular visits have been within normal limits.  She has not plan to establish with [place] in the clinic however I encouraged her to continue to monitor her blood pressures and return to the office if she starts to have consistently elevated blood pressures above 140/90. ? ?  ?  ? ? ?Pt discussed with Dr. Oswaldo Done ? ?Elige Radon, MD ?Internal Medicine Resident PGY-3 ?[facility] ?06/04/2021 12:49 PM  ?  ?

## 2021-06-04 NOTE — Assessment & Plan Note (Addendum)
She presents today for ER follow-up.  She presented to [facility] long ED on 4/30 for 2-hour history of tingling in her right fingers that occurred while she was sitting on the couch.  The tingling involved all 5 digits.  No associated numbness or weakness in the hand or arm.  She did not exhibit any other neurologic symptoms including slurred speech, facial droop, or dysarthria during that time.  She was not experiencing a headache at that time.  Symptoms resolved while in the ED.  Initial labs including a CBC and BMP were unrevealing.  No intracranial imaging was pursued due to low suspicion for bleed/stroke.  She denies a personal history of stroke, hypertension, diabetes, hyperlipidemia.  Does not smoke. ?No recurrent symptoms since ER discharge. ? ?Assessment: Etiology of her symptoms remains unclear.  I considered a nerve or vascular compression due to the way she was sitting in the chair at the time that the symptoms began but I would not expect the paresthesias to last 2 hours.  Low suspicion for TIA given her age and, aside from obesity, minimal risk factors. ? ?Plan:  ?I do not think further work-up for the paresthesia is warranted at this time. Encouraged her to seek reevaluation if her symptoms return. ?Could consider checking an A1c in the setting of obesity however she does not have insurance and does not wish to pursue lab testing at today's visit.  I welcomed her to establish with our office to service the role of PCP however she is not inclined to do so.  She was encouraged to reach out to our office should she change her mind. ?

## 2021-06-04 NOTE — Patient Instructions (Addendum)
It was a pleasure meeting you today. I do not think there is any further workup that we need to pursue at this time. I would encourage you to establish with a primary care provider and our clinic would be happy to serve that role for you. If you decide to do so, please let our office know so we can work to assign you a primary care provider here.  ?

## 2021-06-09 NOTE — Progress Notes (Signed)
Internal Medicine Clinic Attending  Case discussed with Dr. Christian  At the time of the visit.  We reviewed the resident's history and exam and pertinent patient test results.  I agree with the assessment, diagnosis, and plan of care documented in the resident's note.  

## 2021-06-09 NOTE — Addendum Note (Signed)
Addended by: Erlinda Hong T on: 06/09/2021 08:30 AM ? ? Modules accepted: Level of Service ? ?

## 2021-08-25 ENCOUNTER — Ambulatory Visit (HOSPITAL_COMMUNITY)
Admission: EM | Admit: 2021-08-25 | Discharge: 2021-08-25 | Disposition: A | Discharge status: Home / Self Care | Payer: Medicaid Other | Attending: Internal Medicine | Admitting: Internal Medicine

## 2021-08-25 ENCOUNTER — Encounter (HOSPITAL_COMMUNITY): Payer: Self-pay

## 2021-08-25 DIAGNOSIS — H6123 Impacted cerumen, bilateral: Secondary | ICD-10-CM

## 2021-08-25 DIAGNOSIS — H9202 Otalgia, left ear: Secondary | ICD-10-CM

## 2021-08-25 MED ORDER — AMOXICILLIN-POT CLAVULANATE 875-125 MG PO TABS: 1.0000 | ORAL_TABLET | Freq: Two times a day (BID) | ORAL | 0 refills | Status: DC

## 2021-08-25 MED ORDER — CIPROFLOXACIN-DEXAMETHASONE 0.3-0.1 % OT SUSP: 4.0000 [drp] | Freq: Two times a day (BID) | OTIC | 0 refills | Status: AC

## 2021-08-25 MED ORDER — CIPROFLOXACIN-DEXAMETHASONE 0.3-0.1 % OT SUSP: 4.0000 [drp] | Freq: Two times a day (BID) | OTIC | 0 refills | Status: DC

## 2021-08-25 MED ORDER — IBUPROFEN 800 MG PO TABS: 800.0000 mg | ORAL_TABLET | Freq: Three times a day (TID) | ORAL | 0 refills | Status: AC

## 2021-08-25 MED ORDER — CARBAMIDE PEROXIDE 6.5 % OT SOLN: 5.0000 [drp] | Freq: Two times a day (BID) | OTIC | 0 refills | Status: AC

## 2021-08-25 MED ORDER — AMOXICILLIN-POT CLAVULANATE 875-125 MG PO TABS: 1.0000 | ORAL_TABLET | Freq: Two times a day (BID) | ORAL | 0 refills | Status: AC

## 2021-08-25 MED ORDER — IBUPROFEN 800 MG PO TABS
800.0000 mg | ORAL_TABLET | Freq: Once | ORAL | Status: AC
  Administered 2021-08-25: 800 mg via ORAL

## 2021-08-25 MED ORDER — IBUPROFEN 800 MG PO TABS: 800.0000 mg | ORAL_TABLET | Freq: Three times a day (TID) | ORAL | 0 refills | Status: DC

## 2021-08-25 MED ORDER — CARBAMIDE PEROXIDE 6.5 % OT SOLN: 5.0000 [drp] | Freq: Two times a day (BID) | OTIC | 0 refills | Status: DC

## 2021-08-25 MED ORDER — IBUPROFEN 800 MG PO TABS
ORAL_TABLET | ORAL | Status: AC
  Filled 2021-08-25: qty 1

## 2021-08-25 NOTE — Discharge Instructions (Addendum)
We cleaned your ears out today. Your next dose of ibuprofen may be in 8 hours (tomorrow morning).  Take ibuprofen with food to avoid stomach upset.  This may be taken for your pain and inflammation to your throat/neck.  Use Ciprodex eardrops twice daily for the next 7 days to treat your left-sided ear infection. Take Augmentin antibiotic twice daily for the next 7 days with food.   If you develop any new or worsening symptoms or do not improve in the next 2 to 3 days, please return.  If your symptoms are severe, please go to the emergency room.  Follow-up with your primary care provider for further evaluation and management of your symptoms as well as ongoing wellness visits.  I hope you feel better!

## 2021-08-25 NOTE — ED Triage Notes (Signed)
Patient having left ear pain for 2 days. States she has had pain in this ear before but it is not going away this time.  Patient states she does not use anything solid in her ear.  Patient states it is a throbbing sharp pain going for the ear down into her jaw.  Patient tried one of the face slimmer machines 2 days ago.

## 2021-08-28 NOTE — ED Provider Notes (Signed)
[facility]    CSN: [id number] Arrival date & time: 08/25/21  1853      History   Chief Complaint Chief Complaint  Patient presents with   Otalgia    HPI Donna Beck is a 36 y.o. female.   Patient presents to urgent care for evaluation of left-sided ear pain for 2 days.  Patient states that the pain is a throbbing sensation to her left ear and currently rates the pain a 10 on a scale of 0-10.  Denies hearing loss, ear ringing, and drainage from the ear.  She has not been swimming recently but does state that she used a face limiting machine to her neck a couple of days ago that she had never used before and wonders if this could have caused her ear pain.  She reports a swollen bump to her left side of her neck near her ear as well.  Denies recent swimming or going to the beach/the lake.  She does not use Q-tips to her ears but states that in the past she used to use Bobby pins and Q-tips to clean out earwax.  She has attempted use of over-the-counter ear pain otic drops without much relief of her symptoms.  She has also used Tylenol for her symptoms without much relief of her pain.   Otalgia   Past Medical History:  Diagnosis Date   Herpes     Patient Active Problem List   Diagnosis Date Noted   Hospital discharge follow-up 06/04/2021   Elevated blood-pressure reading without diagnosis of hypertension 06/04/2021   Possible exposure to STD 02/05/2019    Past Surgical History:  Procedure Laterality Date   TUBAL LIGATION      OB History     Gravida  2   Para  2   Term  2   Preterm      AB      Living  2      SAB      IAB      Ectopic      Multiple      Live Births  2            Home Medications    Prior to Admission medications   Medication Sig Start Date End Date Taking? Authorizing Provider  amoxicillin-clavulanate (AUGMENTIN) 875-125 MG tablet Take 1 tablet by mouth every 12 (twelve) hours. 08/25/21   Carlisle Beers, FNP  carbamide peroxide (DEBROX) 6.5 % OTIC solution Place 5 drops into both ears 2 (two) times daily. 08/25/21   Carlisle Beers, FNP  ciprofloxacin-dexamethasone (CIPRODEX) OTIC suspension Place 4 drops into the left ear 2 (two) times daily. 08/25/21   Carlisle Beers, FNP  ibuprofen (ADVIL) 800 MG tablet Take 1 tablet (800 mg total) by mouth 3 (three) times daily. 08/25/21   Carlisle Beers, FNP    Family History Family History  Problem Relation Age of Onset   Hypertension Mother    Healthy Father     Social History Social History   Tobacco Use   Smoking status: Never   Smokeless tobacco: Never  Vaping Use   Vaping Use: Never used  Substance Use Topics   Alcohol use: Yes    Comment: occasional   Drug use: Yes    Types: Marijuana     Allergies   Patient has no known allergies.   Review of Systems Review of Systems  HENT:  Positive for ear pain.   Per HPI  Physical Exam Triage Vital Signs ED Triage Vitals  Enc Vitals Group     BP 08/25/21 2008 137/77     Pulse Rate 08/25/21 2008 80     Resp 08/25/21 2008 16     Temp 08/25/21 2008 97.9 F (36.6 C)     Temp Source 08/25/21 2008 Oral     SpO2 08/25/21 2008 100 %     Weight 08/25/21 2010 241 lb 13.5 oz (109.7 kg)     Height 08/25/21 2010 5\' 3"  (1.6 m)     Head Circumference --      Peak Flow --      Pain Score 08/25/21 2009 7     Pain Loc --      Pain Edu? --      Excl. in GC? --    No data found.  Updated Vital Signs BP 137/77 (BP Location: Left Wrist)   Pulse 80   Temp 97.9 F (36.6 C) (Oral)   Resp 16   Ht 5\' 3"  (1.6 m)   Wt 241 lb 13.5 oz (109.7 kg)   LMP 07/19/2021 (Within Days)   SpO2 100%   BMI 42.84 kg/m   Visual Acuity Right Eye Distance:   Left Eye Distance:   Bilateral Distance:    Right Eye Near:   Left Eye Near:    Bilateral Near:     Physical Exam Vitals and nursing note reviewed.  Constitutional:      Appearance: Normal appearance. She is not  ill-appearing or toxic-appearing.     Comments: Very pleasant patient sitting on exam in position of comfort table in no acute distress.   HENT:     Head: Normocephalic and atraumatic.     Right Ear: Hearing and external ear normal.     Left Ear: Hearing and external ear normal.     Ears:     Comments: Right ear: significant amount of cerumen present to initial exam. Second exam shows normal right TM and right ear canal.  Left ear: Significant amount of cerumen as well as white thick drainage present to the left ear canal that is obstructing ability to visualize left tympanic membrane.  Patient is tender to exam of the left ear.  No bloody drainage, deformity, or significant ear canal swelling/erythema present. Still cannot visualize left TM after irrigation of the left ear canal. Purulent drainage/cerumen remains, although lessened after ear irrigation.    Nose: Nose normal.     Mouth/Throat:     Lips: Pink.     Mouth: Mucous membranes are moist.  Eyes:     General: Lids are normal. Vision grossly intact. Gaze aligned appropriately.     Extraocular Movements: Extraocular movements intact.     Conjunctiva/sclera: Conjunctivae normal.  Pulmonary:     Effort: Pulmonary effort is normal.  Abdominal:     Palpations: Abdomen is soft.  Musculoskeletal:     Cervical back: Neck supple.  Skin:    General: Skin is warm and dry.     Capillary Refill: Capillary refill takes less than 2 seconds.     Findings: No rash.  Neurological:     General: No focal deficit present.     Mental Status: She is alert and oriented to person, place, and time. Mental status is at baseline.     Cranial Nerves: No dysarthria or facial asymmetry.     Gait: Gait is intact.  Psychiatric:        Mood and Affect: Mood normal.  Speech: Speech normal.        Behavior: Behavior normal.        Thought Content: Thought content normal.        Judgment: Judgment normal.      UC Treatments / Results  Labs (all  labs ordered are listed, but only abnormal results are displayed) Labs Reviewed - No data to display  EKG   Radiology No results found.  Procedures Procedures (including critical care time)  Medications Ordered in UC Medications  ibuprofen (ADVIL) tablet 800 mg (800 mg Oral Given 08/25/21 2111)    Initial Impression / Assessment and Plan / UC Course  I have reviewed the triage vital signs and the nursing notes.  Pertinent labs & imaging results that were available during my care of the patient were reviewed by me and considered in my medical decision making (see chart for details).  1.  Left ear pain Physical exam to the left ear consistent with left otitis externa.  We will manage this with Ciprodex eardrops 4 drops into the left ear twice daily for the next 7 days.  Patient to avoid submerging head underwater until infection has resolved.  Patient given ibuprofen 800 mg in clinic and may use this every 8 hours at home as well with food for ear pain and inflammation.  Prescribed Augmentin antibiotic due to significant Lee swollen lymph node to the left cervical chain/postauricular area.  Patient denies allergies to antibiotics.  Augmentin twice daily for the next 7 days to be used with food prescribed.   2.  Bilateral impacted cerumen Bilateral ears irrigated in clinic with ear lavage.  As stated in physical exam section, able to visualize normal appearing right tympanic membrane after ear lavage.  Unable to visualize left tympanic membrane due to significant cerumen and white debris inside of the ear.  Suspect this will clear up with Ciprodex eardrops for otitis externa infection as previously mentioned.  Patient may use Debrox eardrops to the bilateral ears in the future as needed after infection clears up to reduce amount of earwax to the ears.  Advised patient not to use any Q-tips or any other objects to the ear canal to reduce amount of cerumen.  Ear pain improved after bilateral ear  lavage as well as ibuprofen 800 mg administration in clinic.  Discussed physical exam and available lab work findings in clinic with patient.  Counseled patient regarding appropriate use of medications and potential side effects for all medications recommended or prescribed today. Discussed red flag signs and symptoms of worsening condition,when to call the PCP office, return to urgent care, and when to seek higher level of care in the emergency department. Patient verbalizes understanding and agreement with plan. All questions answered. Patient discharged in stable condition.  Final Clinical Impressions(s) / UC Diagnoses   Final diagnoses:  Left ear pain  Bilateral impacted cerumen     Discharge Instructions      We cleaned your ears out today. Your next dose of ibuprofen may be in 8 hours (tomorrow morning).  Take ibuprofen with food to avoid stomach upset.  This may be taken for your pain and inflammation to your throat/neck.  Use Ciprodex eardrops twice daily for the next 7 days to treat your left-sided ear infection. Take Augmentin antibiotic twice daily for the next 7 days with food.   If you develop any new or worsening symptoms or do not improve in the next 2 to 3 days, please return.  If your  symptoms are severe, please go to the emergency room.  Follow-up with your primary care provider for further evaluation and management of your symptoms as well as ongoing wellness visits.  I hope you feel better!    ED Prescriptions     Medication Sig Dispense Auth. Provider   ibuprofen (ADVIL) 800 MG tablet  (Status: Discontinued) Take 1 tablet (800 mg total) by mouth 3 (three) times daily. 21 tablet Reita May M, FNP   carbamide peroxide (DEBROX) 6.5 % OTIC solution  (Status: Discontinued) Place 5 drops into both ears 2 (two) times daily. 15 mL Reita May M, FNP   amoxicillin-clavulanate (AUGMENTIN) 875-125 MG tablet  (Status: Discontinued) Take 1 tablet by mouth every  12 (twelve) hours. 14 tablet Reita May M, FNP   ciprofloxacin-dexamethasone (CIPRODEX) OTIC suspension  (Status: Discontinued) Place 4 drops into the left ear 2 (two) times daily. 7.5 mL Reita May M, FNP   amoxicillin-clavulanate (AUGMENTIN) 875-125 MG tablet Take 1 tablet by mouth every 12 (twelve) hours. 14 tablet Carlisle Beers, FNP   carbamide peroxide (DEBROX) 6.5 % OTIC solution Place 5 drops into both ears 2 (two) times daily. 15 mL Reita May M, FNP   ciprofloxacin-dexamethasone (CIPRODEX) OTIC suspension Place 4 drops into the left ear 2 (two) times daily. 7.5 mL Reita May M, FNP   ibuprofen (ADVIL) 800 MG tablet Take 1 tablet (800 mg total) by mouth 3 (three) times daily. 21 tablet Carlisle Beers, FNP      PDMP not reviewed this encounter.   Carlisle Beers, [place] 08/28/21 2128

## 2021-09-29 ENCOUNTER — Ambulatory Visit (HOSPITAL_COMMUNITY)
Admission: EM | Admit: 2021-09-29 | Discharge: 2021-09-29 | Disposition: A | Discharge status: Home / Self Care | Payer: Medicaid Other | Attending: Physician Assistant | Admitting: Physician Assistant

## 2021-09-29 ENCOUNTER — Encounter (HOSPITAL_COMMUNITY): Payer: Self-pay | Admitting: *Deleted

## 2021-09-29 DIAGNOSIS — H6240 Otitis externa in other diseases classified elsewhere, unspecified ear: Secondary | ICD-10-CM

## 2021-09-29 DIAGNOSIS — B369 Superficial mycosis, unspecified: Secondary | ICD-10-CM

## 2021-09-29 MED ORDER — CLOTRIMAZOLE 1 % EX SOLN: 1.0000 | Freq: Two times a day (BID) | CUTANEOUS | 0 refills | Status: AC

## 2021-09-29 NOTE — ED Provider Notes (Signed)
[facility]    CSN: [id number] Arrival date & time: 09/29/21  1433      History   Chief Complaint Chief Complaint  Patient presents with   Otalgia    HPI Donna Beck is a 36 y.o. female.   Pt complains of left ear discomfort with white discharge and foul odor.  She reports she was prescribed oral and topical antibiotics for an ear infection 7/31. She has been using the antibiotic ear drops every day until stopping just two days ago.  She denies fever, chill, congestion.  She has been using saline spray daily, reports removing "pulling out little globs that look like boogers".     Past Medical History:  Diagnosis Date   Herpes     Patient Active Problem List   Diagnosis Date Noted   Hospital discharge follow-up 06/04/2021   Elevated blood-pressure reading without diagnosis of hypertension 06/04/2021   Possible exposure to STD 02/05/2019    Past Surgical History:  Procedure Laterality Date   TUBAL LIGATION      OB History     Gravida  2   Para  2   Term  2   Preterm      AB      Living  2      SAB      IAB      Ectopic      Multiple      Live Births  2            Home Medications    Prior to Admission medications   Medication Sig Start Date End Date Taking? Authorizing Provider  clotrimazole (LOTRIMIN) 1 % external solution Apply 1 Application topically 2 (two) times daily for 10 days. 09/29/21 10/09/21 Yes Ward, Tylene Fantasia, PA-C  amoxicillin-clavulanate (AUGMENTIN) 875-125 MG tablet Take 1 tablet by mouth every 12 (twelve) hours. 08/25/21   Carlisle Beers, FNP  carbamide peroxide (DEBROX) 6.5 % OTIC solution Place 5 drops into both ears 2 (two) times daily. 08/25/21   Carlisle Beers, FNP  ciprofloxacin-dexamethasone (CIPRODEX) OTIC suspension Place 4 drops into the left ear 2 (two) times daily. 08/25/21   Carlisle Beers, FNP  ibuprofen (ADVIL) 800 MG tablet Take 1 tablet (800 mg total) by mouth 3 (three)  times daily. 08/25/21   Carlisle Beers, FNP    Family History Family History  Problem Relation Age of Onset   Hypertension Mother    Healthy Father     Social History Social History   Tobacco Use   Smoking status: Never   Smokeless tobacco: Never  Vaping Use   Vaping Use: Never used  Substance Use Topics   Alcohol use: Not Currently   Drug use: Not Currently    Types: Marijuana     Allergies   Patient has no known allergies.   Review of Systems Review of Systems  Constitutional:  Negative for chills and fever.  HENT:  Positive for ear pain. Negative for sore throat.   Eyes:  Negative for pain and visual disturbance.  Respiratory:  Negative for cough and shortness of breath.   Cardiovascular:  Negative for chest pain and palpitations.  Gastrointestinal:  Negative for abdominal pain and vomiting.  Genitourinary:  Negative for dysuria and hematuria.  Musculoskeletal:  Negative for arthralgias and back pain.  Skin:  Negative for color change and rash.  Neurological:  Negative for seizures and syncope.  All other systems reviewed and are negative.  Physical Exam Triage Vital Signs ED Triage Vitals  Enc Vitals Group     BP 09/29/21 1612 122/86     Pulse Rate 09/29/21 1612 68     Resp 09/29/21 1612 18     Temp 09/29/21 1612 98.3 F (36.8 C)     Temp Source 09/29/21 1612 Oral     SpO2 09/29/21 1612 96 %     Weight --      Height --      Head Circumference --      Peak Flow --      Pain Score 09/29/21 1616 1     Pain Loc --      Pain Edu? --      Excl. in GC? --    No data found.  Updated Vital Signs BP 122/86   Pulse 68   Temp 98.3 F (36.8 C) (Oral)   Resp 18   LMP 09/17/2021 (Exact Date)   SpO2 96%   Visual Acuity Right Eye Distance:   Left Eye Distance:   Bilateral Distance:    Right Eye Near:   Left Eye Near:    Bilateral Near:     Physical Exam Vitals and nursing note reviewed.  Constitutional:      General: She is not in  acute distress.    Appearance: She is well-developed.  HENT:     Head: Normocephalic and atraumatic.     Right Ear: Hearing and tympanic membrane normal.     Ears:     Comments: Left ear canal with white almost mucous like discharge.  TM intact, mildly erythematous.  Eyes:     Conjunctiva/sclera: Conjunctivae normal.  Cardiovascular:     Rate and Rhythm: Normal rate and regular rhythm.     Heart sounds: No murmur heard. Pulmonary:     Effort: Pulmonary effort is normal. No respiratory distress.     Breath sounds: Normal breath sounds.  Abdominal:     Palpations: Abdomen is soft.     Tenderness: There is no abdominal tenderness.  Musculoskeletal:        General: No swelling.     Cervical back: Neck supple.  Skin:    General: Skin is warm and dry.     Capillary Refill: Capillary refill takes less than 2 seconds.  Neurological:     Mental Status: She is alert.  Psychiatric:        Mood and Affect: Mood normal.      UC Treatments / Results  Labs (all labs ordered are listed, but only abnormal results are displayed) Labs Reviewed - No data to display  EKG   Radiology No results found.  Procedures Procedures (including critical care time)  Medications Ordered in UC Medications - No data to display  Initial Impression / Assessment and Plan / UC Course  I have reviewed the triage vital signs and the nursing notes.  Pertinent labs & imaging results that were available during my care of the patient were reviewed by me and considered in my medical decision making (see chart for details).     I suspect patient has developed a fungal infection to the left ear canal with continued use of ciprodex ear drops for several weeks.  Will prescribed clotrimazole ear drops and advised follow up with ENT.  Final Clinical Impressions(s) / UC Diagnoses   Final diagnoses:  Fungal otitis externa     Discharge Instructions      Apply ear drops twice per day Follow up  with ENT  for further evaluation  Discontinue antibiotic drops and cleaning of ear with qtips   ED Prescriptions     Medication Sig Dispense Auth. Provider   clotrimazole (LOTRIMIN) 1 % external solution Apply 1 Application topically 2 (two) times daily for 10 days. 20 mL Ward, Tylene Fantasia, PA-C      PDMP not reviewed this encounter.   Ward, Tylene Fantasia, PA-C 09/29/21 1715

## 2021-09-29 NOTE — Discharge Instructions (Signed)
Apply ear drops twice per day Follow up with ENT for further evaluation  Discontinue antibiotic drops and cleaning of ear with qtips

## 2021-09-29 NOTE — ED Triage Notes (Signed)
Pt was seen 08/25/21 for same pain in left ear; states had improvement with abx, but pain never went completely away. States pain worse again pain and "pulling out little globs that look like boogers" from left ear. Tiny purulent balls noted in left ear canal. Denies fevers.

## 2021-10-09 ENCOUNTER — Encounter (HOSPITAL_COMMUNITY): Payer: Self-pay | Admitting: Emergency Medicine

## 2021-10-09 ENCOUNTER — Emergency Department (HOSPITAL_COMMUNITY)
Admission: EM | Admit: 2021-10-09 | Discharge: 2021-10-09 | Discharge status: Against Medical Advice | Payer: Medicaid Other | Attending: Emergency Medicine | Admitting: Emergency Medicine

## 2021-10-09 ENCOUNTER — Telehealth: Payer: Self-pay

## 2021-10-09 DIAGNOSIS — R202 Paresthesia of skin: Secondary | ICD-10-CM | POA: Insufficient documentation

## 2021-10-09 DIAGNOSIS — Z5321 Procedure and treatment not carried out due to patient leaving prior to being seen by health care provider: Secondary | ICD-10-CM | POA: Insufficient documentation

## 2021-10-09 DIAGNOSIS — M7989 Other specified soft tissue disorders: Secondary | ICD-10-CM | POA: Insufficient documentation

## 2021-10-09 LAB — URINALYSIS, ROUTINE W REFLEX MICROSCOPIC
Bilirubin Urine: NEGATIVE
Glucose, UA: NEGATIVE mg/dL
Hgb urine dipstick: NEGATIVE
Ketones, ur: NEGATIVE mg/dL
Leukocytes,Ua: NEGATIVE
Nitrite: NEGATIVE
Protein, ur: NEGATIVE mg/dL
Specific Gravity, Urine: 1.004 — ABNORMAL LOW (ref 1.005–1.030)
pH: 8 (ref 5.0–8.0)

## 2021-10-09 LAB — CBC
HCT: 40.6 % (ref 36.0–46.0)
Hemoglobin: 13.6 g/dL (ref 12.0–15.0)
MCH: 31.3 pg (ref 26.0–34.0)
MCHC: 33.5 g/dL (ref 30.0–36.0)
MCV: 93.5 fL (ref 80.0–100.0)
Platelets: 259 10*3/uL (ref 150–400)
RBC: 4.34 MIL/uL (ref 3.87–5.11)
RDW: 12.6 % (ref 11.5–15.5)
WBC: 7.6 10*3/uL (ref 4.0–10.5)
nRBC: 0 % (ref 0.0–0.2)

## 2021-10-09 LAB — COMPREHENSIVE METABOLIC PANEL
ALT: 25 U/L (ref 0–44)
AST: 21 U/L (ref 15–41)
Albumin: 3.8 g/dL (ref 3.5–5.0)
Alkaline Phosphatase: 50 U/L (ref 38–126)
Anion gap: 5 (ref 5–15)
BUN: 7 mg/dL (ref 6–20)
CO2: 25 mmol/L (ref 22–32)
Calcium: 9.1 mg/dL (ref 8.9–10.3)
Chloride: 110 mmol/L (ref 98–111)
Creatinine, Ser: 0.83 mg/dL (ref 0.44–1.00)
GFR, Estimated: >60 mL/min (ref >60)
Glucose, Bld: 95 mg/dL (ref 70–99)
Potassium: 4.2 mmol/L (ref 3.5–5.1)
Sodium: 140 mmol/L (ref 135–145)
Total Bilirubin: 0.6 mg/dL (ref 0.3–1.2)
Total Protein: 7.4 g/dL (ref 6.5–8.1)

## 2021-10-09 NOTE — ED Provider Triage Note (Signed)
Emergency Medicine Provider Triage Evaluation Note  Donna Beck , a 36 y.o. female  was evaluated in triage.  Pt complains of both ankles swelling. Noticed swelling in both ankles around 1230 p today. Swelling comes and goes since July. No calf tenderness. No CP and SOB. No issues with urination. No pain but sore ankles. Also endorses tingling in fingertips and toes bilaterally, intermittent.  No PMH  Review of Systems  Positive: See above Negative: See above  Physical Exam  BP (!) 138/94 (BP Location: Left Wrist)   Pulse 76   Temp 98.9 F (37.2 C) (Oral)   Resp 18   LMP 09/17/2021 (Exact Date)   SpO2 100%  Gen:   Awake, no distress   Resp:  Normal effort  MSK:   Moves extremities without difficulty  Other:  Mild non pitting edema around ankles  Medical Decision Making  Medically screening exam initiated at 4:14 PM.  Appropriate orders placed.  Renesha P Mikkelson was informed that the remainder of the evaluation will be completed by another provider, this initial triage assessment does not replace that evaluation, and the importance of remaining in the ED until their evaluation is complete.  Plan: ua, CMP, and CBC   Gareth Eagle, PA-C 10/09/21 1621

## 2021-10-09 NOTE — Telephone Encounter (Signed)
Requesting to speak with a nurse about swollen ankle. No open slot for appt tomorrow and next week. Please call pt back.

## 2021-10-09 NOTE — ED Triage Notes (Signed)
Pt here with some slightly swollen bil ankles , no chest pain no sob or n.v

## 2021-10-10 NOTE — Telephone Encounter (Addendum)
Attempts to call patient at 8:36 AM today. Patient went to the ER at [facility].
